# Patient Record
Sex: Male | Born: 1975 | Race: Black or African American | Hispanic: No | Marital: Married | Smoking: Never smoker
Health system: Southern US, Community
[De-identification: ages and names within clinical notes are randomized; demographics above are authoritative.]

## PROBLEM LIST (undated history)

## (undated) DIAGNOSIS — I10 Essential (primary) hypertension: Secondary | ICD-10-CM

## (undated) DIAGNOSIS — E7800 Pure hypercholesterolemia, unspecified: Secondary | ICD-10-CM

## (undated) DIAGNOSIS — F419 Anxiety disorder, unspecified: Secondary | ICD-10-CM

## (undated) HISTORY — DX: Essential (primary) hypertension: I10

## (undated) HISTORY — DX: Pure hypercholesterolemia, unspecified: E78.00

## (undated) HISTORY — DX: Anxiety disorder, unspecified: F41.9

---

## 2009-10-08 ENCOUNTER — Emergency Department: Payer: Self-pay | Admitting: Emergency Medicine

## 2010-02-07 ENCOUNTER — Emergency Department: Payer: Self-pay | Admitting: Emergency Medicine

## 2012-11-01 ENCOUNTER — Emergency Department: Payer: Self-pay | Admitting: Emergency Medicine

## 2013-05-27 ENCOUNTER — Emergency Department: Payer: Self-pay | Admitting: Emergency Medicine

## 2014-08-16 IMAGING — US US PELVIS LIMITED
1 series · 14 of 25 positions shown · non-contrast
Comparison: none

REASON FOR EXAM: pain to left testicle for several days
COMMENTS:

PROCEDURE:     US  - US TESTICULAR  - November 01, 2012  [DATE]
RESULT:     Comparison: None
TECHNIQUE: Multiple gray-scale, color-flow Doppler, and spectral waveform
tracings of the testicles and testicular vasculature were obtained.

[Series 1: us pelvis limited · 0.08mm/px · 14 of 55 slices shown]
[im 1/55]
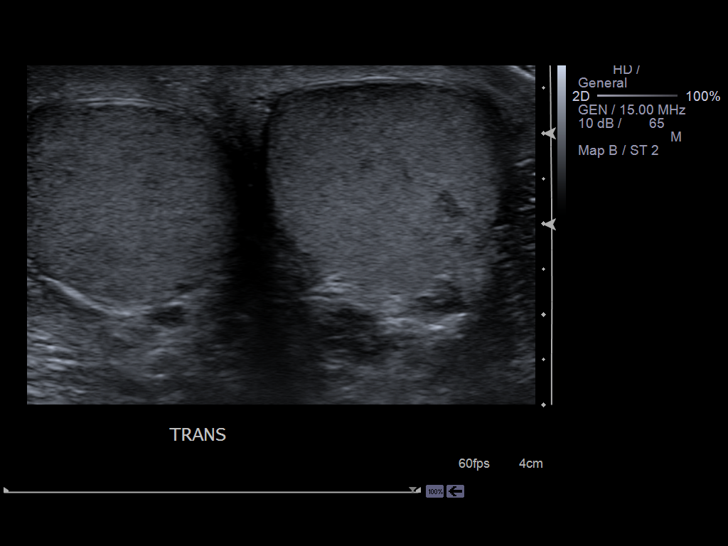
[im 5/55]
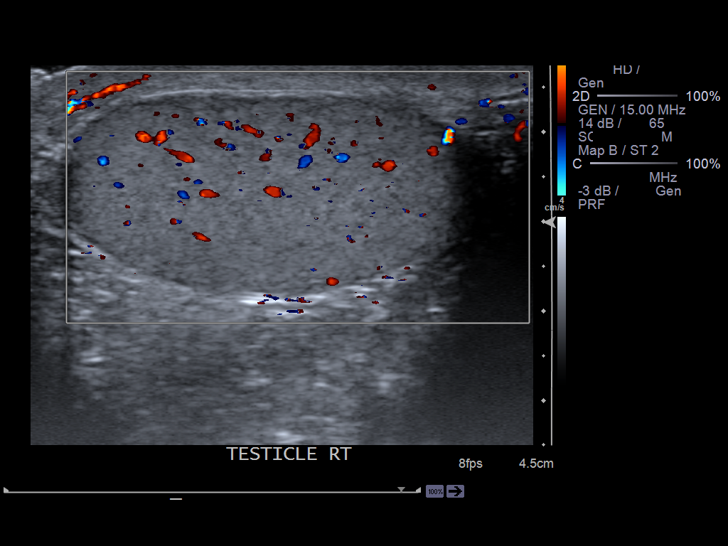
[im 10/55]
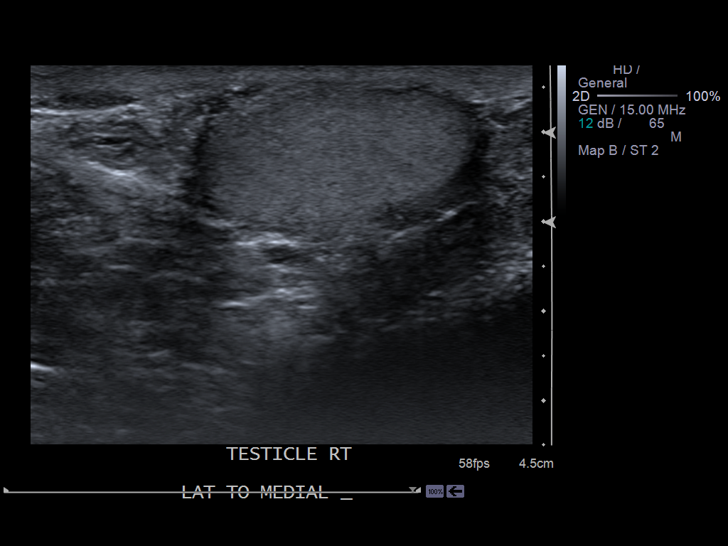
[im 14/55]
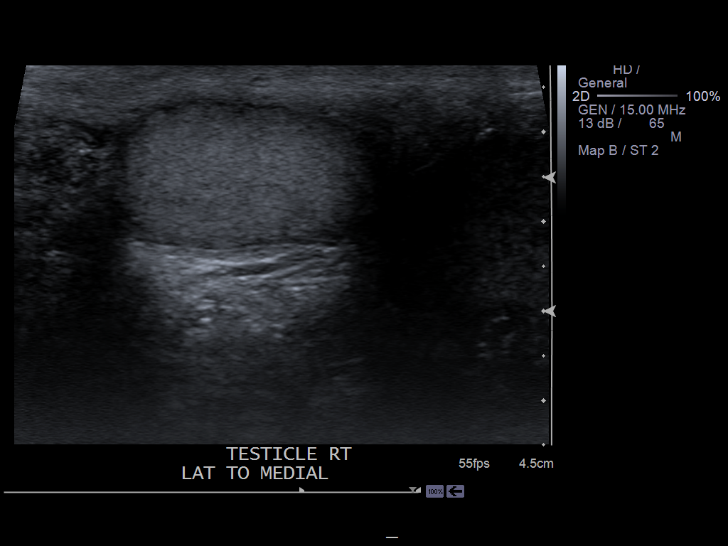
[im 19/55]
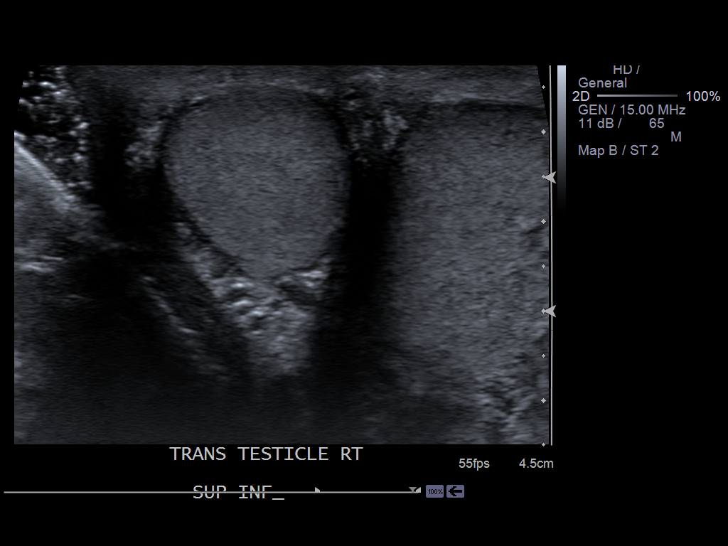
[im 21/55]
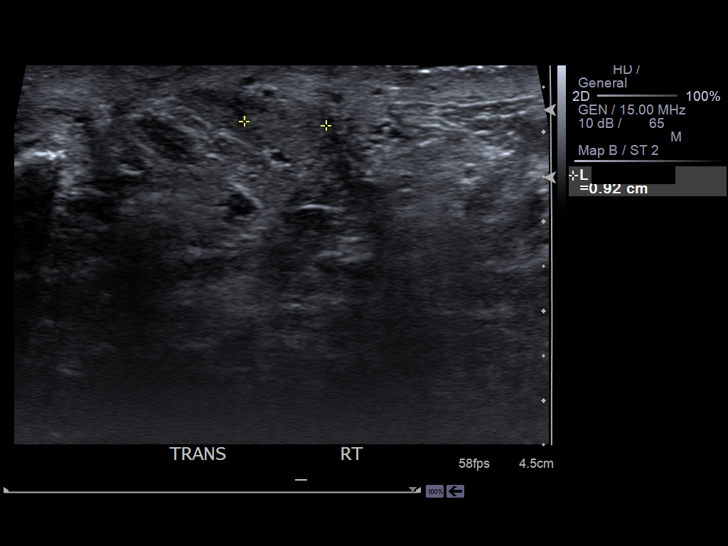
[im 25/55]
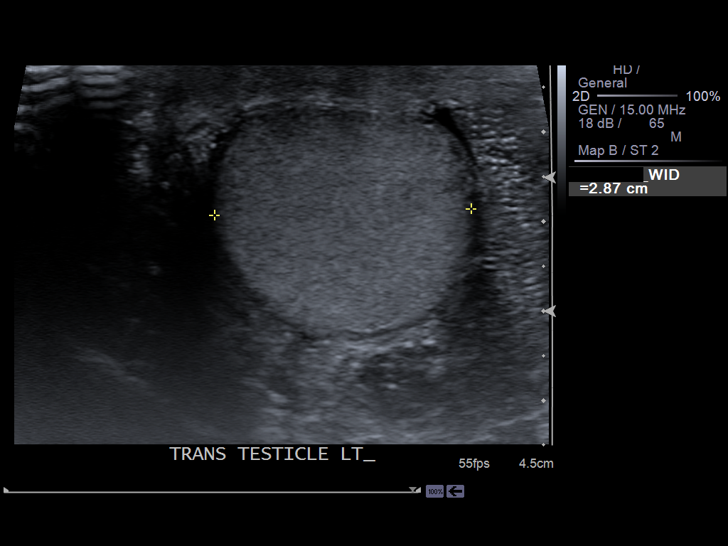
[im 30/55]
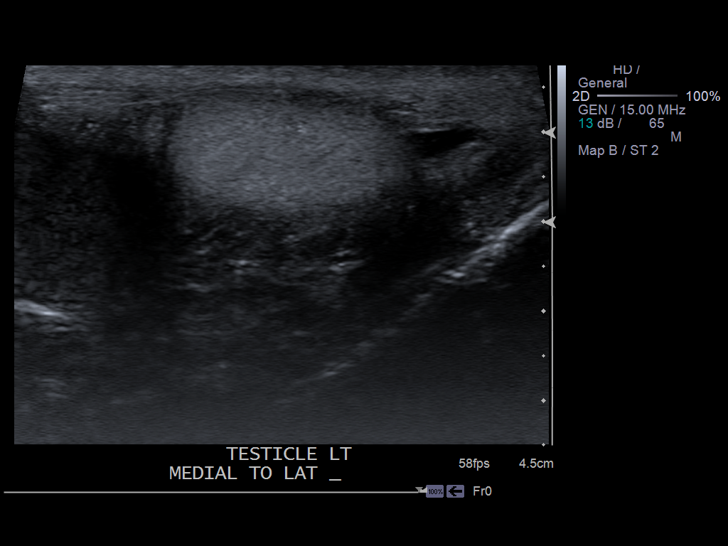
[im 34/55]
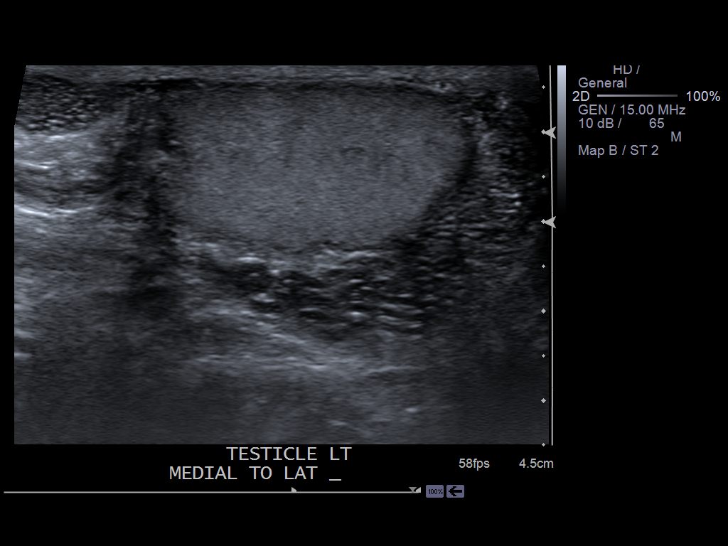
[im 37/55]
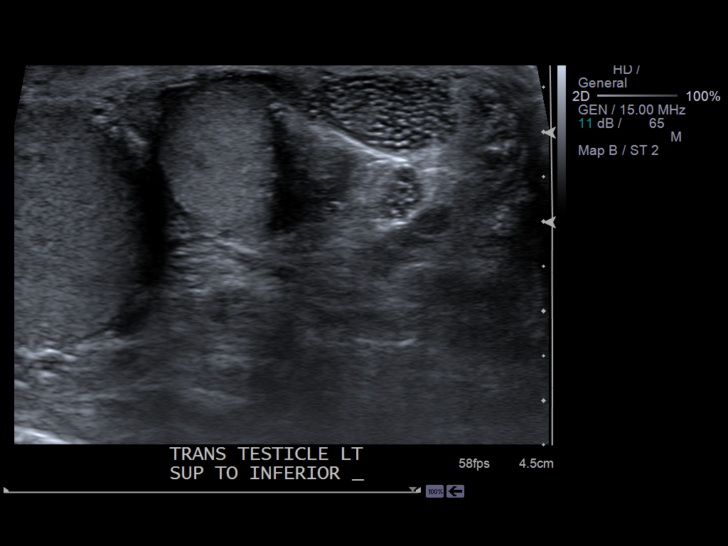
[im 41/55]
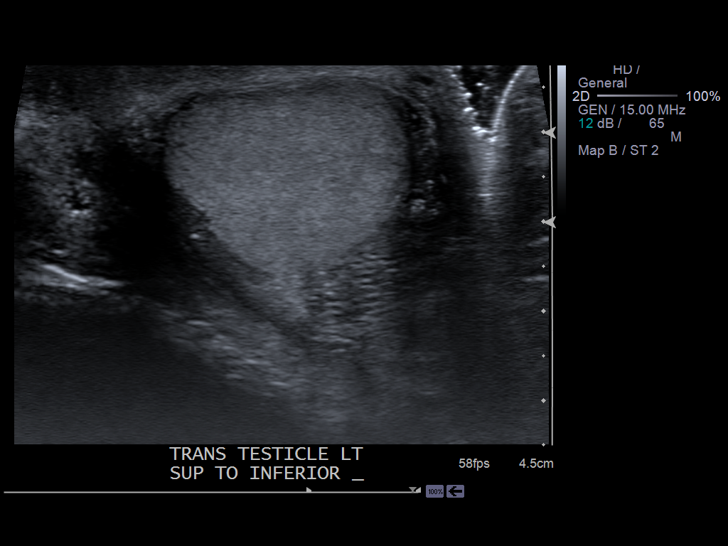
[im 46/55]
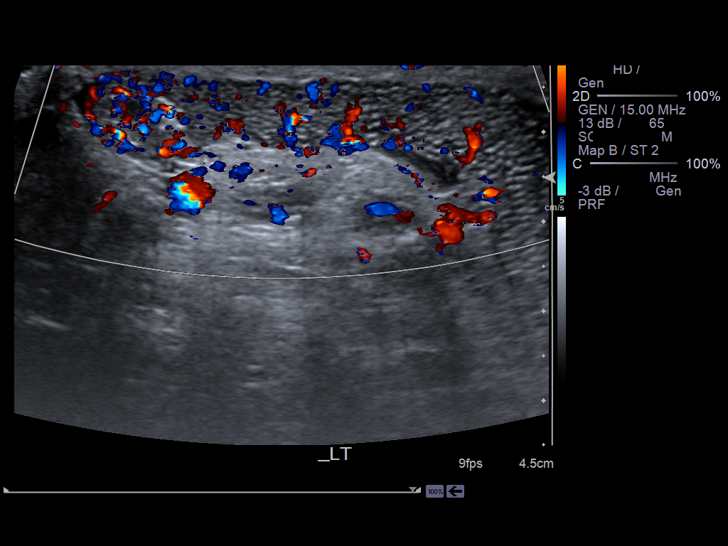
[im 50/55]
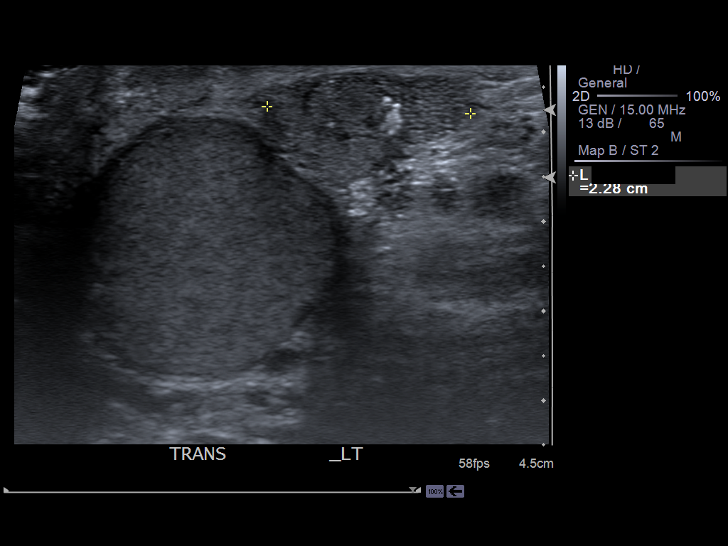
[im 55/55]
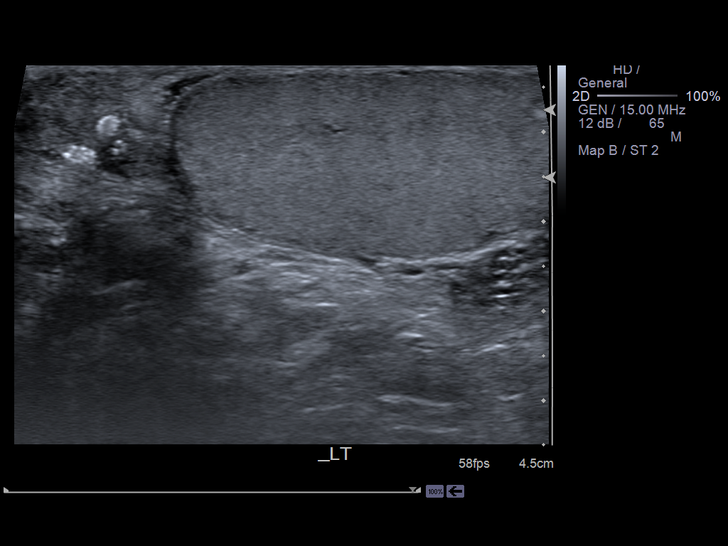

[14 of 25 positions shown; findings below may reference images not displayed]

FINDINGS: The right testicle measures 4.7 x 2.6 x 2.8 cm. The left testicle measures
4.8 x 2.4 x 2.9 cm. The testicles are relatively homogeneous in echotexture.
Arterial and venous spectral Doppler waveforms are demonstrated in the
bilateral testicles. The right epididymis is unremarkable. The left
epididymis appears somewhat enlarged, heterogeneous, and hyperemic. Findings
suggest left-sided epididymitis.
IMPRESSION: 1. Findings suggesting left-sided epididymitis. Clinical correlation is
suggested.
2. No evidence of testicular torsion.

## 2022-07-14 ENCOUNTER — Encounter: Payer: Self-pay | Admitting: Emergency Medicine

## 2022-07-14 ENCOUNTER — Emergency Department
Admission: EM | Admit: 2022-07-14 | Discharge: 2022-07-14 | Disposition: A | Discharge status: Home / Self Care | Payer: BC Managed Care – PPO | Attending: Emergency Medicine | Admitting: Emergency Medicine

## 2022-07-14 ENCOUNTER — Emergency Department: Payer: BC Managed Care – PPO

## 2022-07-14 ENCOUNTER — Other Ambulatory Visit: Payer: Self-pay

## 2022-07-14 DIAGNOSIS — R42 Dizziness and giddiness: Secondary | ICD-10-CM | POA: Insufficient documentation

## 2022-07-14 DIAGNOSIS — R079 Chest pain, unspecified: Secondary | ICD-10-CM

## 2022-07-14 DIAGNOSIS — I10 Essential (primary) hypertension: Secondary | ICD-10-CM | POA: Diagnosis not present

## 2022-07-14 DIAGNOSIS — R7309 Other abnormal glucose: Secondary | ICD-10-CM | POA: Diagnosis not present

## 2022-07-14 DIAGNOSIS — R0789 Other chest pain: Secondary | ICD-10-CM | POA: Insufficient documentation

## 2022-07-14 DIAGNOSIS — F419 Anxiety disorder, unspecified: Secondary | ICD-10-CM | POA: Diagnosis not present

## 2022-07-14 LAB — BASIC METABOLIC PANEL
Anion gap: 9 (ref 5–15)
BUN: 11 mg/dL (ref 6–20)
CO2: 27 mmol/L (ref 22–32)
Calcium: 9.4 mg/dL (ref 8.9–10.3)
Chloride: 104 mmol/L (ref 98–111)
Creatinine, Ser: 1.05 mg/dL (ref 0.61–1.24)
GFR, Estimated: >60 mL/min (ref >60)
Glucose, Bld: 114 mg/dL — ABNORMAL HIGH (ref 70–99)
Potassium: 3.5 mmol/L (ref 3.5–5.1)
Sodium: 140 mmol/L (ref 135–145)

## 2022-07-14 LAB — CBC
HCT: 46 % (ref 39.0–52.0)
Hemoglobin: 15.4 g/dL (ref 13.0–17.0)
MCH: 31 pg (ref 26.0–34.0)
MCHC: 33.5 g/dL (ref 30.0–36.0)
MCV: 92.6 fL (ref 80.0–100.0)
Platelets: 259 10*3/uL (ref 150–400)
RBC: 4.97 MIL/uL (ref 4.22–5.81)
RDW: 12.4 % (ref 11.5–15.5)
WBC: 6.9 10*3/uL (ref 4.0–10.5)
nRBC: 0 % (ref 0.0–0.2)

## 2022-07-14 LAB — TROPONIN I (HIGH SENSITIVITY)
Troponin I (High Sensitivity): 4 ng/L (ref <18)
Troponin I (High Sensitivity): 7 ng/L (ref <18)

## 2022-07-14 MED ORDER — LACTATED RINGERS IV BOLUS
1000.0000 mL | Freq: Once | INTRAVENOUS | Status: AC
  Administered 2022-07-14: 1000 mL via INTRAVENOUS

## 2022-07-14 MED ORDER — IBUPROFEN 400 MG PO TABS
400.0000 mg | ORAL_TABLET | Freq: Once | ORAL | Status: AC
  Administered 2022-07-14: 400 mg via ORAL
  Filled 2022-07-14: qty 1

## 2022-07-14 NOTE — Discharge Instructions (Signed)
Your cardiac work-up emergency department is reassuring.  Please follow-up with the cardiologist.  I have placed a referral you should hear from them within the next several days.  If you do not you can call the office at the number listed above.

## 2022-07-14 NOTE — ED Provider Notes (Signed)
[facility] Provider Note    Event Date/Time   First MD Initiated Contact with Patient 07/14/22 1338     (approximate)   History   Near Syncope and Chest Pain   HPI  Kaveon Holub is a 46 y.o. male with past medical history of hypertension hyperlipidemia who presents with chest pain and lightheadedness.  Patient's symptoms started about 2 weeks ago.  He has had intermittent tight sensation in his chest.  Comes on randomly nonexertional nonpleuritic.  During these episodes he feels anxious and occasionally lightheaded.  Denies diaphoresis or nausea.  Pain does not come on when he is doing strenuous things at work.  Has been becoming more frequent.  He is having pain constant since last night.  Denies shortness of breath.  Does feel anxious.  Today he felt like he was going to pass out and felt very anxious but did not actually lose consciousness.   She was seen in [facility] ED on 9/12 and had normal cardiac enzymes.  Followed up with his primary doctor who prescribed him BuSpar for anxiety.     History reviewed. No pertinent past medical history.  There are no problems to display for this patient.    Physical Exam  Triage Vital Signs: ED Triage Vitals  Enc Vitals Group     BP 07/14/22 1115 (!) 144/91     Pulse Rate 07/14/22 1115 83     Resp 07/14/22 1115 17     Temp 07/14/22 1115 98.7 F (37.1 C)     Temp Source 07/14/22 1115 Oral     SpO2 07/14/22 1115 96 %     Weight 07/14/22 1116 267 lb (121.1 kg)     Height 07/14/22 1116 6\' 2"  (1.88 m)     Head Circumference --      Peak Flow --      Pain Score 07/14/22 1116 5     Pain Loc --      Pain Edu? --      Excl. in [place]? --     Most recent vital signs: Vitals:   07/14/22 1115  BP: (!) 144/91  Pulse: 83  Resp: 17  Temp: 98.7 F (37.1 C)  SpO2: 96%     General: Awake, no distress.  CV:  Good peripheral perfusion. No edema Resp:  Normal effort. Lung are clear Abd:  No distention.  Neuro:              Awake, Alert, Oriented x 3  Other:     ED Results / Procedures / Treatments  Labs (all labs ordered are listed, but only abnormal results are displayed) Labs Reviewed  BASIC METABOLIC PANEL - Abnormal; Notable for the following components:      Result Value   Glucose, Bld 114 (*)    All other components within normal limits  CBC  TROPONIN I (HIGH SENSITIVITY)  TROPONIN I (HIGH SENSITIVITY)     EKG  EKG interpretation performed by myself: NSR, nml axis, nml intervals, no acute ischemic changes    RADIOLOGY I reviewed and interpreted the CXR which does not show any acute cardiopulmonary process    PROCEDURES:  Critical Care performed: No  Procedures   MEDICATIONS ORDERED IN ED: Medications  ibuprofen (ADVIL) tablet 400 mg (400 mg Oral Given 07/14/22 1441)     IMPRESSION / MDM / ASSESSMENT AND PLAN / ED COURSE  I reviewed the triage vital signs and the nursing notes.  Patient's presentation is most consistent with acute presentation with potential threat to life or bodily function.  Differential diagnosis includes, but is not limited to, unstable angina, anxiety, GI related pain, musculoskeletal pain, pulmonary embolism  Patient is a 46 year old male with history of hypertension hyperlipidemia presents with intermittent chest pain and lightheadedness.  He has been having intermittent chest pain for about 2 weeks.  At onset he was seen in the [facility] ED with reassuring enzymes and EKG and was discharged.  Has seen his primary care doctor and is being treated for anxiety started on BuSpar recently.  His pain has been increasing in frequency and has been more constant over the last day or so.  It is atypical in nature and that it is nonexertional not associated with shortness of breath diaphoresis or nausea.  Described as a tight sensation.  Patient is mildly hypertensive but vitals are otherwise within normal limits.  He looks well lungs  are clear there is no edema or signs of DVT on exam.  Patient's EKG is nonischemic chest x-ray is normal.  Overall my suspicion for cardiac cause of his pain is low.  Unstable angina certainly is still in the differential diagnosis and I do think that he would benefit from cardiology outpatient work-up given the increasing frequency of his symptoms.  His first troponin here is negative.  Plan to repeat given ongoing symptoms.  If this is negative I think it is reasonable for him to follow-up as an outpatient.  Consider pulmonary embolism given his presyncope however he is PERC negative and with the intermittent nature of the symptoms and the chronicity feel this is less likely.   Clinical Course as of 07/14/22 1443  Mon Jul 14, 2022  1343 Troponin I (High Sensitivity): 4 [CC]  1345 Glucose(!): 114 [CC]  1345 Troponin I (High Sensitivity): 4 [CC]    Clinical Course User Index [CC] Chrisandra Carota, [place], [place]     FINAL CLINICAL IMPRESSION(S) / ED DIAGNOSES   Final diagnoses:  Chest pain, unspecified type     Rx / DC Orders   ED Discharge Orders          Ordered    Ambulatory referral to Cardiology       Comments: If you have not heard from the Cardiology office within the next 72 hours please call [phone].   07/14/22 1443             Note:  This document was prepared using Dragon voice recognition software and may include unintentional dictation errors.   Georga Hacking, MD 07/14/22 [phone]

## 2022-07-14 NOTE — ED Triage Notes (Signed)
First Nurse Note:  Pt via EMS from home. Pt c/o near syncope, states he has episodes of lightheadedness and faintness since Thursday. Pt has not loss consciousness. Pt has a hx anxiety and panic attack. Pt had recent changes in anxiety medication. Pt is A&Ox4 and NAD  99.2 temp 163/91 100% on RA 84 HR

## 2022-07-14 NOTE — ED Triage Notes (Signed)
Pt presents to ED via EMS with c/o of having a near syncope episode this morning while sitting down. Pt states recently GY'FV buspar to him one week ago and states he has only 1 dose due to "I don't like the way it makes me feel". Pt is A&Ox4 at this time. NAD noted.

## 2022-07-15 ENCOUNTER — Encounter: Payer: Self-pay | Admitting: Emergency Medicine

## 2022-07-15 ENCOUNTER — Other Ambulatory Visit: Payer: Self-pay

## 2022-07-15 ENCOUNTER — Emergency Department: Payer: BC Managed Care – PPO

## 2022-07-15 DIAGNOSIS — R079 Chest pain, unspecified: Secondary | ICD-10-CM | POA: Diagnosis present

## 2022-07-15 DIAGNOSIS — Z20822 Contact with and (suspected) exposure to covid-19: Secondary | ICD-10-CM | POA: Diagnosis not present

## 2022-07-15 DIAGNOSIS — I10 Essential (primary) hypertension: Secondary | ICD-10-CM | POA: Insufficient documentation

## 2022-07-15 DIAGNOSIS — F419 Anxiety disorder, unspecified: Secondary | ICD-10-CM | POA: Insufficient documentation

## 2022-07-15 LAB — BASIC METABOLIC PANEL
Anion gap: 9 (ref 5–15)
BUN: 11 mg/dL (ref 6–20)
CO2: 28 mmol/L (ref 22–32)
Calcium: 9.2 mg/dL (ref 8.9–10.3)
Chloride: 101 mmol/L (ref 98–111)
Creatinine, Ser: 1.07 mg/dL (ref 0.61–1.24)
GFR, Estimated: >60 mL/min (ref >60)
Glucose, Bld: 125 mg/dL — ABNORMAL HIGH (ref 70–99)
Potassium: 3.5 mmol/L (ref 3.5–5.1)
Sodium: 138 mmol/L (ref 135–145)

## 2022-07-15 LAB — SARS CORONAVIRUS 2 BY RT PCR: SARS Coronavirus 2 by RT PCR: NEGATIVE

## 2022-07-15 LAB — CBC
HCT: 42.4 % (ref 39.0–52.0)
Hemoglobin: 14 g/dL (ref 13.0–17.0)
MCH: 30.9 pg (ref 26.0–34.0)
MCHC: 33 g/dL (ref 30.0–36.0)
MCV: 93.6 fL (ref 80.0–100.0)
Platelets: 247 10*3/uL (ref 150–400)
RBC: 4.53 MIL/uL (ref 4.22–5.81)
RDW: 12.5 % (ref 11.5–15.5)
WBC: 9.5 10*3/uL (ref 4.0–10.5)
nRBC: 0 % (ref 0.0–0.2)

## 2022-07-15 LAB — TROPONIN I (HIGH SENSITIVITY): Troponin I (High Sensitivity): 5 ng/L (ref <18)

## 2022-07-15 NOTE — ED Triage Notes (Signed)
Pt was seen in ED yesterday for chest pain and hypertension. Pt states he bought a bp cuff and tonight BP was 180/110. Pt complains of slight tightness in chest.

## 2022-07-16 ENCOUNTER — Encounter: Payer: Self-pay | Admitting: Emergency Medicine

## 2022-07-16 ENCOUNTER — Emergency Department
Admission: EM | Admit: 2022-07-16 | Discharge: 2022-07-16 | Disposition: A | Discharge status: Home / Self Care | Payer: BC Managed Care – PPO | Attending: Emergency Medicine | Admitting: Emergency Medicine

## 2022-07-16 DIAGNOSIS — I10 Essential (primary) hypertension: Secondary | ICD-10-CM

## 2022-07-16 DIAGNOSIS — F419 Anxiety disorder, unspecified: Secondary | ICD-10-CM

## 2022-07-16 LAB — TROPONIN I (HIGH SENSITIVITY): Troponin I (High Sensitivity): 5 ng/L (ref <18)

## 2022-07-16 MED ORDER — HYDROXYZINE HCL 25 MG PO TABS: 25.0000 mg | ORAL_TABLET | Freq: Every evening | ORAL | 0 refills | Status: AC | PRN

## 2022-07-16 NOTE — ED Provider Notes (Signed)
[facility] Provider Note    Event Date/Time   First MD Initiated Contact with Patient 07/16/22 [phone]     (approximate)   History   Hypertension and Chest Pain   HPI  Jake Baker is a 46 y.o. male who presents for evaluation of high blood pressure.  He has a history of anxiety and high blood pressure and he and his wife have been going through some very stressful times at home recently.  He has a primary care provider in [place] who has been working with him and prescribing some antianxiety medications, but he has had some side effects to them, most notably and most recently to [place].  He has continued taking amlodipine 5 mg a day for many years and they have not adjusted that because they are trying to adjust his medication regimen to account for the anxiety as well.  They got a new blood pressure cuff and they checked his blood pressure at home tonight and it was around 270 systolic and about 623 diastolic, so they came to the ED for evaluation.  When all that was happening he was having some slight tightness in his chest, but those symptoms have completely resolved.  He denies fever, sore throat, nausea, vomiting, shortness of breath, and dysuria.  He does not smoke and does not have diabetes.     Physical Exam   Triage Vital Signs: ED Triage Vitals  Enc Vitals Group     BP 07/15/22 2224 (!) 151/91     Pulse Rate 07/15/22 2224 88     Resp 07/15/22 2224 18     Temp 07/15/22 2224 98.2 F (36.8 C)     Temp Source 07/15/22 2224 Oral     SpO2 07/15/22 2224 97 %     Weight 07/15/22 2225 121.1 kg (267 lb)     Height 07/15/22 2225 1.88 m (6\' 2" )     Head Circumference --      Peak Flow --      Pain Score 07/15/22 2225 4     Pain Loc --      Pain Edu? --      Excl. in [place]? --     Most recent vital signs: Vitals:   07/16/22 0218 07/16/22 0411  BP: (!) 142/97 (!) 144/91  Pulse: 79 71  Resp: 18 18  Temp: 98.5 F (36.9 C)   SpO2: 95% 97%      General: Awake, no distress.  Well-appearing. CV:  Good peripheral perfusion.  Normal heart sounds.  Regular rate and rhythm. Resp:  Normal effort.  Lungs are clear to auscultation bilaterally.  No accessory muscle usage. Abd:  No distention.  No tenderness to palpation of the abdomen. Other:  Mood and affect are very normal and appropriate.  Patient is pleasant and conversant, a pleasure to talk to, no psychiatric warning signs or symptoms.   ED Results / Procedures / Treatments   Labs (all labs ordered are listed, but only abnormal results are displayed) Labs Reviewed  BASIC METABOLIC PANEL - Abnormal; Notable for the following components:      Result Value   Glucose, Bld 125 (*)    All other components within normal limits  SARS CORONAVIRUS 2 BY RT PCR  CBC  TROPONIN I (HIGH SENSITIVITY)  TROPONIN I (HIGH SENSITIVITY)     EKG  ED ECG REPORT I, Hinda Kehr, the attending physician, personally viewed and interpreted this ECG.  Date: 07/15/2022 EKG Time: 22:  26 Rate: 84 Rhythm: normal sinus rhythm QRS Axis: normal Intervals: normal ST/T Wave abnormalities: normal Narrative Interpretation: no evidence of acute ischemia    RADIOLOGY I viewed and interpreted the patient's two-view chest x-ray.  I see no evidence of pneumonia, pneumothorax, widened mediastinum, or other acute abnormality.  I also read the radiologist's report, which confirmed no acute findings.    PROCEDURES:  Critical Care performed: No  Procedures   MEDICATIONS ORDERED IN ED: Medications - No data to display   IMPRESSION / MDM / ASSESSMENT AND PLAN / ED COURSE  I reviewed the triage vital signs and the nursing notes.                              Differential diagnosis includes, but is not limited to, anxiety/panic attacks, essential hypertension, electrolyte or metabolic abnormality, ACS, pneumonia, pneumothorax.  Patient's presentation is most consistent with acute presentation  with potential threat to life or bodily function.  Labs/studies ordered: EKG, two-view chest x-ray, basic metabolic panel, high-sensitivity troponin x2, COVID-19 PCR, CBC.  Fortunately the patient's work-up has been very reassuring.  There are no acute abnormalities on any of his lab work.  No ischemia on EKG which I interpreted myself.  As documented above, I also interpreted his chest x-ray and see no evidence of acute abnormality.  The patient is PERC negative and low risk for ACS based on HEAR score.  His blood pressure came down to about 144/91 without treatment.  I suspect that anxiety plays a large role even though he also has essential hypertension.  I talked with him and his wife at some length about how his primary care provider needs to be the one to manage his medications, both for hypertension as well as for his anxiety.  They are very open and amenable to that.  I answered all of their questions.  I agreed to give them a small number of hydroxyzine to see if it would help at night when he is having most of his issues, but he has an appointment in the morning with his PCP, so I recommended that they talk first with his regular doctor about whether or not they feel that would be an appropriate medication to try.  I gave my usual and customary return precautions.       FINAL CLINICAL IMPRESSION(S) / ED DIAGNOSES   Final diagnoses:  Anxiety  Essential hypertension     Rx / DC Orders   ED Discharge Orders          Ordered    hydrOXYzine (ATARAX) 25 MG tablet  At bedtime PRN        07/16/22 0441             Note:  This document was prepared using Dragon voice recognition software and may include unintentional dictation errors.   Loleta Rose, MD 07/16/22 [phone]

## 2022-07-16 NOTE — Discharge Instructions (Signed)
As we discussed, your evaluation was reassuring tonight, and your blood pressure came down significantly without any additional treatment.  We recommend that you follow-up with your primary care provider to discuss additional medication options.  It is likely that your provider will want to find a good regimen for you for your anxiety before making adjustments to your blood pressure medicine, since being stressed and anxious will drive up your blood pressure.  We gave you a small prescription for medication called hydroxyzine which can be helpful with anxiety, particularly at night, if used sparingly.  You may want to talk about this option with your regular doctor before filling the prescription.  Continue your other medications as recommended by your primary provider and return to the emergency department if you develop new or worsening symptoms that concern you.
